# Patient Record
Sex: Male | Born: 1995 | Race: Black or African American | Hispanic: No | Marital: Single | Smoking: Never smoker
Health system: Southern US, Community
[De-identification: ages and names within clinical notes are randomized; demographics above are authoritative.]

## PROBLEM LIST (undated history)

## (undated) HISTORY — PX: KNEE SURGERY: SHX244

---

## 2018-09-18 ENCOUNTER — Other Ambulatory Visit: Payer: Self-pay | Admitting: Family Medicine

## 2018-09-18 ENCOUNTER — Ambulatory Visit
Admission: RE | Admit: 2018-09-18 | Discharge: 2018-09-18 | Disposition: A | Discharge status: Home / Self Care | Source: Ambulatory Visit | Attending: Family Medicine | Admitting: Family Medicine

## 2018-09-18 DIAGNOSIS — T1490XA Injury, unspecified, initial encounter: Secondary | ICD-10-CM

## 2018-09-18 DIAGNOSIS — S6992XA Unspecified injury of left wrist, hand and finger(s), initial encounter: Secondary | ICD-10-CM | POA: Insufficient documentation

## 2018-09-18 DIAGNOSIS — X58XXXA Exposure to other specified factors, initial encounter: Secondary | ICD-10-CM | POA: Insufficient documentation

## 2019-03-08 ENCOUNTER — Emergency Department

## 2019-03-08 ENCOUNTER — Other Ambulatory Visit: Payer: Self-pay

## 2019-03-08 ENCOUNTER — Encounter: Payer: Self-pay | Admitting: Emergency Medicine

## 2019-03-08 ENCOUNTER — Emergency Department
Admission: EM | Admit: 2019-03-08 | Discharge: 2019-03-08 | Disposition: A | Discharge status: Home / Self Care | Attending: Emergency Medicine | Admitting: Emergency Medicine

## 2019-03-08 DIAGNOSIS — T84498A Other mechanical complication of other internal orthopedic devices, implants and grafts, initial encounter: Secondary | ICD-10-CM | POA: Diagnosis not present

## 2019-03-08 DIAGNOSIS — Y792 Prosthetic and other implants, materials and accessory orthopedic devices associated with adverse incidents: Secondary | ICD-10-CM | POA: Diagnosis not present

## 2019-03-08 MED ORDER — CEPHALEXIN 500 MG PO CAPS: 500.0000 mg | ORAL_CAPSULE | Freq: Three times a day (TID) | ORAL | 0 refills | Status: AC

## 2019-03-08 NOTE — Discharge Instructions (Addendum)
Follow-up with [facility].  Please call them in the morning for an appointment.  Dr. Martha Clan would like to see you in the office and he is aware you are coming to be evaluated.  Take the antibiotic as prescribed since the wound is open.  Return if worsening.

## 2019-03-08 NOTE — Consult Note (Signed)
Called by PA in ER, Greig Right regarding this patient.   He is reported to have exposed ACL hardware from the anterior tibia.  There is no active drainage reported.  I recommended the patient by put on antibiotics and to call our office for an appointment.  Patient will need hardware removed, which can likely be done later this week.

## 2019-03-08 NOTE — ED Provider Notes (Signed)
[facility] Emergency Department Provider Note  ____________________________________________   None    (approximate)  I have reviewed the triage vital signs and the nursing notes.   HISTORY  Chief Complaint Post-op Problem    HPI Justin Montgomery is a 23 y.o. male presents emergency department complaining of hardware from an ACL reconstruction poking through the skin of the left knee.  He states he had the reconstruction done in [place] in 2015.  States his body has been "trying to recheck the hardware since it was done".  He states that within the last week the hardware actually poked through the skin.  He denies any fever chills.  Denies any drainage from the area.    History reviewed. No pertinent past medical history.  There are no active problems to display for this patient.   Past Surgical History:  Procedure Laterality Date  . KNEE SURGERY      Prior to Admission medications   Medication Sig Start Date End Date Taking? Authorizing Provider  cephALEXin (KEFLEX) 500 MG capsule Take 1 capsule (500 mg total) by mouth 3 (three) times daily. 03/08/19   Faythe Ghee, PA-C    Allergies Patient has no known allergies.  No family history on file.  Social History Social History   Tobacco Use  . Smoking status: Not on file  Substance Use Topics  . Alcohol use: Not on file  . Drug use: Not on file    Review of Systems  Constitutional: No fever/chills Eyes: No visual changes. ENT: No sore throat. Respiratory: Denies cough Genitourinary: Negative for dysuria. Musculoskeletal: Negative for back pain.  Hardware from surgery showing through the left knee Skin: Negative for rash.    ____________________________________________   PHYSICAL EXAM:  VITAL SIGNS: ED Triage Vitals [03/08/19 1508]  Enc Vitals Group     BP (!) 164/92     Pulse Rate (!) 51     Resp 20     Temp 98.3 F (36.8 C)     Temp Source Oral     SpO2 100 %      Weight 170 lb (77.1 kg)     Height 5\' 11"  (1.803 m)     Head Circumference      Peak Flow      Pain Score 3     Pain Loc      Pain Edu?      Excl. in GC?     Constitutional: Alert and oriented. Well appearing and in no acute distress. Eyes: Conjunctivae are normal.  Head: Atraumatic. Nose: No congestion/rhinnorhea. Mouth/Throat: Mucous membranes are moist.   Neck:  supple no lymphadenopathy noted Cardiovascular: Normal rate, regular rhythm. H Respiratory: Normal respiratory effort.  No retractions GU: deferred Musculoskeletal: FROM all extremities, warm and well perfused, the anterior aspect of the left knee has open skin where he can see the plastic screw from his ACL repair.  No drainage is noted. Neurologic:  Normal speech and language.  Skin:  Skin is warm, dry and intact. No rash noted. Psychiatric: Mood and affect are normal. Speech and behavior are normal.  ____________________________________________   LABS (all labs ordered are listed, but only abnormal results are displayed)  Labs Reviewed - No data to display ____________________________________________   ____________________________________________  RADIOLOGY  X-ray of the left knee shows partial tunneling of the ACL repair  ____________________________________________   PROCEDURES  Procedure(s) performed: No  Procedures    ____________________________________________   INITIAL IMPRESSION / ASSESSMENT AND  PLAN / ED COURSE  Pertinent labs & imaging results that were available during my care of the patient were reviewed by me and considered in my medical decision making (see chart for details).   Patient is a 23 year old male presents emergency department complaining of his hardware showing through the skin of his left knee from [place]  Physical exam shows the hardware to be showing 3 (skin.  No drainage or redness is noted.  X-ray of the left  knee shows partial total knee  Page Dr. Martha ClanKrasinski     As part of my medical decision making, I reviewed the following data within the electronic MEDICAL RECORD NUMBER Nursing notes reviewed and incorporated, Old chart reviewed, Radiograph reviewed x-ray of the left knee shows tunneling of the ACL with exposed hardware, A consult was requested and obtained from this/these consultant(s) Orthopedics, Evaluated by EM attending Dr. Scotty CourtStafford, Notes from prior ED visits and [place] Controlled Substance Database  ____________________________________________   FINAL CLINICAL IMPRESSION(S) / ED DIAGNOSES  Final diagnoses:  Exposed orthopaedic hardware (HCC)      NEW MEDICATIONS STARTED DURING THIS VISIT:  New Prescriptions   CEPHALEXIN (KEFLEX) 500 MG CAPSULE    Take 1 capsule (500 mg total) by mouth 3 (three) times daily.     Note:  This document was prepared using Dragon voice recognition software and may include unintentional dictation errors.    Faythe GheeFisher, Valery Amedee W, PA-C 03/08/19 1628    Sharman CheekStafford, Phillip, MD 03/08/19 [phone]

## 2019-03-08 NOTE — ED Triage Notes (Signed)
States had a L knee surgery 5 years ago. States 5 days ago began to be able to see hardware coming through skin.

## 2019-03-08 NOTE — ED Notes (Signed)

## 2019-03-11 ENCOUNTER — Ambulatory Visit: Admitting: Anesthesiology

## 2019-03-11 ENCOUNTER — Ambulatory Visit
Admission: RE | Admit: 2019-03-11 | Discharge: 2019-03-11 | Disposition: A | Discharge status: Home / Self Care | Source: Ambulatory Visit | Attending: Orthopedic Surgery | Admitting: Orthopedic Surgery

## 2019-03-11 ENCOUNTER — Other Ambulatory Visit: Payer: Self-pay | Admitting: Orthopedic Surgery

## 2019-03-11 ENCOUNTER — Encounter
Admission: RE | Disposition: A | Discharge status: Home / Self Care | Payer: Self-pay | Source: Ambulatory Visit | Attending: Orthopedic Surgery

## 2019-03-11 ENCOUNTER — Encounter: Payer: Self-pay | Admitting: Anesthesiology

## 2019-03-11 ENCOUNTER — Other Ambulatory Visit: Payer: Self-pay

## 2019-03-11 DIAGNOSIS — Z419 Encounter for procedure for purposes other than remedying health state, unspecified: Secondary | ICD-10-CM

## 2019-03-11 DIAGNOSIS — Y831 Surgical operation with implant of artificial internal device as the cause of abnormal reaction of the patient, or of later complication, without mention of misadventure at the time of the procedure: Secondary | ICD-10-CM | POA: Diagnosis not present

## 2019-03-11 DIAGNOSIS — T84197A Other mechanical complication of internal fixation device of bone of left lower leg, initial encounter: Secondary | ICD-10-CM | POA: Insufficient documentation

## 2019-03-11 HISTORY — PX: INCISION AND DRAINAGE OF WOUND: SHX1803

## 2019-03-11 SURGERY — IRRIGATION AND DEBRIDEMENT WOUND
Anesthesia: General | Site: Knee | Laterality: Left

## 2019-03-11 MED ORDER — ONDANSETRON HCL 4 MG/2ML IJ SOLN
INTRAMUSCULAR | Status: AC
  Filled 2019-03-11: qty 2

## 2019-03-11 MED ORDER — ACETAMINOPHEN 10 MG/ML IV SOLN
INTRAVENOUS | Status: AC
  Filled 2019-03-11: qty 100

## 2019-03-11 MED ORDER — OXYCODONE HCL 5 MG PO TABS: 5.0000 mg | ORAL_TABLET | Freq: Once | ORAL | Status: DC | PRN

## 2019-03-11 MED ORDER — PROPOFOL 10 MG/ML IV BOLUS
INTRAVENOUS | Status: DC | PRN
  Administered 2019-03-11: 150 mg via INTRAVENOUS

## 2019-03-11 MED ORDER — CEFAZOLIN SODIUM-DEXTROSE 2-4 GM/100ML-% IV SOLN
INTRAVENOUS | Status: AC
  Filled 2019-03-11: qty 100

## 2019-03-11 MED ORDER — FENTANYL CITRATE (PF) 100 MCG/2ML IJ SOLN
INTRAMUSCULAR | Status: AC
  Filled 2019-03-11: qty 2

## 2019-03-11 MED ORDER — MIDAZOLAM HCL 2 MG/2ML IJ SOLN
INTRAMUSCULAR | Status: AC
  Filled 2019-03-11: qty 2

## 2019-03-11 MED ORDER — ONDANSETRON HCL 4 MG PO TABS: 4.0000 mg | ORAL_TABLET | Freq: Three times a day (TID) | ORAL | 0 refills | Status: AC | PRN

## 2019-03-11 MED ORDER — ACETAMINOPHEN 10 MG/ML IV SOLN
INTRAVENOUS | Status: DC | PRN
  Administered 2019-03-11: 1000 mg via INTRAVENOUS

## 2019-03-11 MED ORDER — FENTANYL CITRATE (PF) 100 MCG/2ML IJ SOLN
INTRAMUSCULAR | Status: DC | PRN
  Administered 2019-03-11 (×3): 25 ug via INTRAVENOUS

## 2019-03-11 MED ORDER — PROPOFOL 10 MG/ML IV BOLUS
INTRAVENOUS | Status: AC
  Filled 2019-03-11: qty 20

## 2019-03-11 MED ORDER — GENTAMICIN SULFATE 40 MG/ML IJ SOLN
INTRAMUSCULAR | Status: AC
  Filled 2019-03-11: qty 2

## 2019-03-11 MED ORDER — BUPIVACAINE HCL (PF) 0.5 % IJ SOLN
INTRAMUSCULAR | Status: DC | PRN
  Administered 2019-03-11: 8 mL

## 2019-03-11 MED ORDER — HYDROCODONE-ACETAMINOPHEN 5-325 MG PO TABS: 1.0000 | ORAL_TABLET | ORAL | 0 refills | Status: AC | PRN

## 2019-03-11 MED ORDER — ONDANSETRON HCL 4 MG/2ML IJ SOLN
INTRAMUSCULAR | Status: DC | PRN
  Administered 2019-03-11: 4 mg via INTRAVENOUS

## 2019-03-11 MED ORDER — DEXAMETHASONE SODIUM PHOSPHATE 10 MG/ML IJ SOLN
INTRAMUSCULAR | Status: AC
  Filled 2019-03-11: qty 1

## 2019-03-11 MED ORDER — MIDAZOLAM HCL 2 MG/2ML IJ SOLN
INTRAMUSCULAR | Status: DC | PRN
  Administered 2019-03-11: 2 mg via INTRAVENOUS

## 2019-03-11 MED ORDER — GLYCOPYRROLATE 0.2 MG/ML IJ SOLN
INTRAMUSCULAR | Status: AC
  Filled 2019-03-11: qty 1

## 2019-03-11 MED ORDER — DEXAMETHASONE SODIUM PHOSPHATE 10 MG/ML IJ SOLN
INTRAMUSCULAR | Status: DC | PRN
  Administered 2019-03-11: 10 mg via INTRAVENOUS

## 2019-03-11 MED ORDER — OXYCODONE HCL 5 MG/5ML PO SOLN: 5.0000 mg | Freq: Once | ORAL | Status: DC | PRN

## 2019-03-11 MED ORDER — LIDOCAINE HCL (CARDIAC) PF 100 MG/5ML IV SOSY
PREFILLED_SYRINGE | INTRAVENOUS | Status: DC | PRN
  Administered 2019-03-11: 100 mg via INTRAVENOUS

## 2019-03-11 MED ORDER — CEFAZOLIN SODIUM-DEXTROSE 2-4 GM/100ML-% IV SOLN
2.0000 g | Freq: Once | INTRAVENOUS | Status: AC
  Administered 2019-03-11: 2 g via INTRAVENOUS

## 2019-03-11 MED ORDER — GLYCOPYRROLATE 0.2 MG/ML IJ SOLN
INTRAMUSCULAR | Status: DC | PRN
  Administered 2019-03-11: 0.2 mg via INTRAVENOUS

## 2019-03-11 MED ORDER — LACTATED RINGERS IV SOLN
INTRAVENOUS | Status: DC
  Administered 2019-03-11: 11:00:00 via INTRAVENOUS

## 2019-03-11 MED ORDER — FENTANYL CITRATE (PF) 100 MCG/2ML IJ SOLN: 25.0000 ug | INTRAMUSCULAR | Status: DC | PRN

## 2019-03-11 MED ORDER — SODIUM CHLORIDE 0.9 % IV SOLN
INTRAVENOUS | Status: DC | PRN
  Administered 2019-03-11: 11:00:00 1000 mL

## 2019-03-11 SURGICAL SUPPLY — 40 items
BANDAGE ACE 4X5 VEL STRL LF (GAUZE/BANDAGES/DRESSINGS) ×3 IMPLANT
BANDAGE ACE 6X5 VEL STRL LF (GAUZE/BANDAGES/DRESSINGS) ×3 IMPLANT
BLADE SURG 15 STRL LF DISP TIS (BLADE) ×1 IMPLANT
BLADE SURG 15 STRL SS (BLADE) ×2
BNDG ESMARK 4X12 TAN STRL LF (GAUZE/BANDAGES/DRESSINGS) ×3 IMPLANT
CANISTER SUCT 1200ML W/VALVE (MISCELLANEOUS) ×3 IMPLANT
CANISTER SUCT 3000ML PPV (MISCELLANEOUS) ×3 IMPLANT
CAST PADDING 3X4FT ST 30246 (SOFTGOODS) ×2
COVER WAND RF STERILE (DRAPES) ×3 IMPLANT
CUFF TOURN SGL QUICK 24 (TOURNIQUET CUFF)
CUFF TOURN SGL QUICK 30 (TOURNIQUET CUFF)
CUFF TRNQT CYL 24X4X16.5-23 (TOURNIQUET CUFF) IMPLANT
CUFF TRNQT CYL 30X4X21-28X (TOURNIQUET CUFF) IMPLANT
DRAPE U-SHAPE 47X51 STRL (DRAPES) ×3 IMPLANT
DURAPREP 26ML APPLICATOR (WOUND CARE) ×6 IMPLANT
ELECT REM PT RETURN 9FT ADLT (ELECTROSURGICAL) ×3
ELECTRODE REM PT RTRN 9FT ADLT (ELECTROSURGICAL) ×1 IMPLANT
GAUZE SPONGE 4X4 12PLY STRL (GAUZE/BANDAGES/DRESSINGS) ×3 IMPLANT
GAUZE XEROFORM 1X8 LF (GAUZE/BANDAGES/DRESSINGS) ×3 IMPLANT
GLOVE BIOGEL PI IND STRL 9 (GLOVE) ×1 IMPLANT
GLOVE BIOGEL PI INDICATOR 9 (GLOVE) ×2
GLOVE SURG 9.0 ORTHO LTXF (GLOVE) ×6 IMPLANT
GOWN STRL REUS TWL 2XL XL LVL4 (GOWN DISPOSABLE) ×3 IMPLANT
GOWN STRL REUS W/ TWL LRG LVL3 (GOWN DISPOSABLE) ×1 IMPLANT
GOWN STRL REUS W/TWL LRG LVL3 (GOWN DISPOSABLE) ×2
MAT ABSORB  FLUID 56X50 GRAY (MISCELLANEOUS) ×2
MAT ABSORB FLUID 56X50 GRAY (MISCELLANEOUS) ×1 IMPLANT
NS IRRIG 1000ML POUR BTL (IV SOLUTION) ×3 IMPLANT
PACK EXTREMITY ARMC (MISCELLANEOUS) ×3 IMPLANT
PAD ABD DERMACEA PRESS 5X9 (GAUZE/BANDAGES/DRESSINGS) ×3 IMPLANT
PAD CAST CTTN 3X4 STRL (SOFTGOODS) ×1 IMPLANT
PULSAVAC PLUS IRRIG FAN TIP (DISPOSABLE)
SOL .9 NS 3000ML IRR  AL (IV SOLUTION)
SOL .9 NS 3000ML IRR UROMATIC (IV SOLUTION) IMPLANT
STOCKINETTE STRL 4IN 9604848 (GAUZE/BANDAGES/DRESSINGS) ×3 IMPLANT
SUT VIC AB 2-0 CT1 27 (SUTURE) ×2
SUT VIC AB 2-0 CT1 TAPERPNT 27 (SUTURE) ×1 IMPLANT
SUT VIC AB 3-0 SH 27 (SUTURE) ×2
SUT VIC AB 3-0 SH 27X BRD (SUTURE) ×1 IMPLANT
TIP FAN IRRIG PULSAVAC PLUS (DISPOSABLE) IMPLANT

## 2019-03-11 NOTE — Transfer of Care (Signed)
Immediate Anesthesia Transfer of Care Note  Patient: Justin Montgomery  Procedure(s) Performed: IRRIGATION AND DEBRIDEMENT WOUND ACL SCREW REMOVAL (Left Knee)  Patient Location: PACU  Anesthesia Type:General  Level of Consciousness: sedated  Airway & Oxygen Therapy: Patient Spontanous Breathing and Patient connected to face mask oxygen  Post-op Assessment: Report given to RN and Post -op Vital signs reviewed and stable  Post vital signs: Reviewed and stable  Last Vitals:  Vitals Value Taken Time  BP 125/77 03/11/2019 12:05 PM  Temp 36.7 C 03/11/2019 12:05 PM  Pulse 84 03/11/2019 12:07 PM  Resp 20 03/11/2019 12:07 PM  SpO2 100 % 03/11/2019 12:07 PM  Vitals shown include unvalidated device data.  Last Pain:  Vitals:   03/11/19 1031  TempSrc: Temporal  PainSc: 0-No pain         Complications: No apparent anesthesia complications

## 2019-03-11 NOTE — Anesthesia Post-op Follow-up Note (Signed)
Anesthesia QCDR form completed.        

## 2019-03-11 NOTE — Op Note (Addendum)
03/11/2019  12:23 PM  PATIENT:  Justin Montgomery    PRE-OPERATIVE DIAGNOSIS:  EXPOSED ACL HARDWARE  POST-OPERATIVE DIAGNOSIS:  Same  PROCEDURE:  IRRIGATION AND DEBRIDEMENT OF LEFT KNEE WOUND AND  ACL TIBIAL HARDWARE REMOVAL  SURGEON:  Thornton Park, MD  ANESTHESIA:   General  Tourniquet time:  26 minutes  PREOPERATIVE INDICATIONS:  Justin Montgomery is a  23 y.o. male with a diagnosis of EXPOSED ACL HARDWARE of the left knee for a few days.  Patient states he has had interim swelling over the tibial surgical site.  Patient was seen in the emergency department on 03/09/2019 and placed on antibiotics.  Denies fevers chills or other signs of infection.  I discussed the risks and benefits of surgery. The risks include but are not limited to infection, nerve or blood vessel injury, joint stiffness or loss of motion, persistent pain, weakness or instability, hardware failure and the need for further surgery. Medical risks include but are not limited to DVT and pulmonary embolism, myocardial infarction, stroke, pneumonia, respiratory failure and death. Patient understood these risks and wished to proceed.   OPERATIVE IMPLANTS: None  OPERATIVE FINDINGS: Closed left tibial ACL fixation device  OPERATIVE PROCEDURE: Patient was met in the preoperative area.  A preop H&P was performed.  The left knee was signed with the word yes and my initials according the hospital's correct site of surgery protocol.  The patient's request I spoke with his mother by phone prior to surgery.  I explained to both the patient and his mother the details of the operation as well as the postoperative course.  They were both in agreement with the surgical plan.  Patient was brought to the operating room where he was placed supine on the operative table.  He underwent general anesthesia.  A tourniquet was applied to the left thigh.  He was prepped and draped in a sterile fashion.  A timeout was performed to verify the patient  name, date of birth, medical record number, correct site of surgery and correct procedure to be performed.  The timeout was also used to confirm the patient received antibiotics.  He received 2 g of Kefzol prior to the onset of the case.  Once all in attendance were in agreement the case began.  Examination under anesthesia of the left knee demonstrated ROM 0-120 degrees.  Patient did not demonstrate increased laxity on Lachman's or anterior drawer testing and he had a negative pivot shift.  There was no laxity to varus or valgus stress testing.  Patient had approximately a 1 cm opening over the tibial hardware.  The hardware was visualized through this wound.  Patient had his left lower extremity Esmarch.  The tourniquet was inflated to 275 mmHg.  It was inflated for a total of 26 minutes.  The longitudinal opening was expanded to allow for full exposure of the tibial hardware.  The full incision was 2 cm.  Patient had a Biomet Tuneloc implant extending from the tibial tunnel.  This was easily removed with a Coker as it was very loose.  The wound was copiously irrigated.  The subcutaneous tissue was debrided and sent to the lab for infection analysis.  A culture swab of the tibial tunnel was also performed and sent to lab for Gram stain and culture.  There was no purulent drainage or other signs of infection at the time of surgery.  The tibial tunnel was gently curetted.  Again the wound including the tibial tunnel was  copiously irrigated with gentamicin infused saline.  The skin edges of his initial wound were freshened with a #15 blade.    The wound was then closed with 3-0 Vicryl and 4-0 nylon.  Was injected with half percent Marcaine plain.  A dry sterile dressing was applied.  Tourniquet was deflated at 26 minutes.  The patient was awoken and brought to the PACU in stable condition.  I was scrubbed and present for the entire case.  I spoke with his mother by phone from the PACU to let her know her son  was stable in the recovery room the case had been performed without complication.  I will follow-up on the intraoperative cultures.  Patient will remain on his antibiotic until follow-up.

## 2019-03-11 NOTE — H&P (Signed)
PREOPERATIVE H&P  Chief Complaint: EXPOSED ACL HARDWARE, LEFT KNEE  HPI: Justin Montgomery is a 23 y.o. male who presents for preoperative history and physical with a diagnosis of EXPOSED TIBIAL ACL HARDWARE, LEFT KNEE x few days.  Patient had ACL reconstructed in 2015 in [place].  He states he has had issues with prominent hardware for a long time but it just came through the skin a few days ago.  He was seen in the [facility] emergency department.  A clean bandage was applied and he was placed on antibiotics.  He presented to my office this morning and the decision was made to proceed urgently with surgical removal of the implant to avoid infection.  History reviewed. No pertinent past medical history. Past Surgical History:  Procedure Laterality Date  . KNEE SURGERY     Social History   Socioeconomic History  . Marital status: Single    Spouse name: Not on file  . Number of children: Not on file  . Years of education: Not on file  . Highest education level: Not on file  Occupational History  . Not on file  Social Needs  . Financial resource strain: Not on file  . Food insecurity:    Worry: Not on file    Inability: Not on file  . Transportation needs:    Medical: Not on file    Non-medical: Not on file  Tobacco Use  . Smoking status: Not on file  Substance and Sexual Activity  . Alcohol use: Not on file  . Drug use: Not on file  . Sexual activity: Not on file  Lifestyle  . Physical activity:    Days per week: Not on file    Minutes per session: Not on file  . Stress: Not on file  Relationships  . Social connections:    Talks on phone: Not on file    Gets together: Not on file    Attends religious service: Not on file    Active member of club or organization: Not on file    Attends meetings of clubs or organizations: Not on file    Relationship status: Not on file  Other Topics Concern  . Not on file  Social History Narrative  . Not on file   History reviewed.  No pertinent family history. No Known Allergies Prior to Admission medications   Medication Sig Start Date End Date Taking? Authorizing Provider  cephALEXin (KEFLEX) 500 MG capsule Take 1 capsule (500 mg total) by mouth 3 (three) times daily. 03/08/19  Yes Fisher, Roselyn Bering, PA-C     Positive ROS: All other systems have been reviewed and were otherwise negative with the exception of those mentioned in the HPI and as above.  Physical Exam: General: Alert, no acute distress Cardiovascular: Regular rate and rhythm, no murmurs rubs or gallops.  No pedal edema Respiratory: Clear to auscultation bilaterally, no wheezes rales or rhonchi. No cyanosis, no use of accessory musculature GI: No organomegaly, abdomen is soft and non-tender nondistended with positive bowel sounds. Skin: Skin intact, no lesions within the operative field. Neurologic: Sensation intact distally Psychiatric: Patient is competent for consent with normal mood and affect Lymphatic: No cervical lymphadenopathy  MUSCULOSKELETAL: Left knee: Patient has a 1 cm opening of the skin overlying the tibial implant.  The tibial implant is visible through the open wound.  There is no drainage.  There is no significant swelling of the lower extremity.  There is no knee effusion.  Patient  has no significant tenderness.  He is distally neurovascular intact with intact motor function without weakness.  Assessment: EXPOSED ACL HARDWARE, LEFT KNEE  Plan: Plan for Procedure(s): IRRIGATION AND DEBRIDEMENT WOUND ACL SCREW REMOVAL, LEFT KNEE  I reviewed the details of the operation as well as the postoperative course the patient.  I have spoken with his mother by phone at his request.  She understands the plan for surgery and is in agreement with it.  I discussed the risks and benefits of surgery. The risks include but are not limited to infection, bleeding, nerve or blood vessel injury, joint stiffness or loss of motion, persistent pain, weakness  or instability,hardware failure and the need for further surgery. Medical risks include but are not limited to DVT and pulmonary embolism, myocardial infarction, stroke, pneumonia, respiratory failure and death. Patient understood these risks and wished to proceed.     Juanell FairlyKevin Ren Grasse, MD   03/11/2019 11:02 AM

## 2019-03-11 NOTE — Anesthesia Procedure Notes (Signed)
Procedure Name: LMA Insertion Date/Time: 03/11/2019 11:13 AM Performed by: Irving Burton, CRNA Pre-anesthesia Checklist: Patient identified, Emergency Drugs available, Suction available and Patient being monitored Patient Re-evaluated:Patient Re-evaluated prior to induction Oxygen Delivery Method: Circle system utilized Preoxygenation: Pre-oxygenation with 100% oxygen Induction Type: IV induction Ventilation: Mask ventilation without difficulty LMA: LMA inserted LMA Size: 5.0 Number of attempts: 1 Placement Confirmation: ETT inserted through vocal cords under direct vision,  positive ETCO2 and breath sounds checked- equal and bilateral Tube secured with: Tape Dental Injury: Teeth and Oropharynx as per pre-operative assessment

## 2019-03-11 NOTE — Anesthesia Preprocedure Evaluation (Signed)
Anesthesia Evaluation  Patient identified by MRN, date of birth, ID band Patient awake    Reviewed: Allergy & Precautions, H&P , NPO status , Patient's Chart, lab work & pertinent test results  History of Anesthesia Complications Negative for: history of anesthetic complications  Airway Mallampati: I  TM Distance: >3 FB Neck ROM: full    Dental  (+) Chipped   Pulmonary neg pulmonary ROS, neg shortness of breath,           Cardiovascular Exercise Tolerance: Good (-) angina(-) Past MI and (-) DOE negative cardio ROS       Neuro/Psych negative psych ROS   GI/Hepatic negative GI ROS, Neg liver ROS, neg GERD  ,  Endo/Other  negative endocrine ROS  Renal/GU      Musculoskeletal   Abdominal   Peds  Hematology negative hematology ROS (+)   Anesthesia Other Findings History reviewed. No pertinent past medical history.  Past Surgical History: No date: KNEE SURGERY  BMI    Body Mass Index:  23.71 kg/m      Reproductive/Obstetrics negative OB ROS                             Anesthesia Physical Anesthesia Plan  ASA: I  Anesthesia Plan: General LMA   Post-op Pain Management:    Induction: Intravenous  PONV Risk Score and Plan: Dexamethasone, Ondansetron, Midazolam and Treatment may vary due to age or medical condition  Airway Management Planned: LMA  Additional Equipment:   Intra-op Plan:   Post-operative Plan: Extubation in OR  Informed Consent: I have reviewed the patients History and Physical, chart, labs and discussed the procedure including the risks, benefits and alternatives for the proposed anesthesia with the patient or authorized representative who has indicated his/her understanding and acceptance.     Dental Advisory Given  Plan Discussed with: Anesthesiologist, CRNA and Surgeon  Anesthesia Plan Comments: (Patient consented for risks of anesthesia including but  not limited to:  - adverse reactions to medications - damage to teeth, lips or other oral mucosa - sore throat or hoarseness - Damage to heart, brain, lungs or loss of life  Patient voiced understanding.)        Anesthesia Quick Evaluation

## 2019-03-11 NOTE — Anesthesia Postprocedure Evaluation (Signed)
Anesthesia Post Note  Patient: Justin Montgomery  Procedure(s) Performed: IRRIGATION AND DEBRIDEMENT WOUND ACL SCREW REMOVAL (Left Knee)  Patient location during evaluation: PACU Anesthesia Type: General Level of consciousness: awake and alert Pain management: pain level controlled Vital Signs Assessment: post-procedure vital signs reviewed and stable Respiratory status: spontaneous breathing, nonlabored ventilation, respiratory function stable and patient connected to nasal cannula oxygen Cardiovascular status: blood pressure returned to baseline and stable Postop Assessment: no apparent nausea or vomiting Anesthetic complications: no     Last Vitals:  Vitals:   03/11/19 1250 03/11/19 1304  BP: 116/75 120/78  Pulse: (!) 57 (!) 56  Resp: 18 16  Temp: 36.6 C 36.8 C  SpO2: 100% 100%    Last Pain:  Vitals:   03/11/19 1304  TempSrc: Temporal  PainSc: 0-No pain                 Justin Montgomery

## 2019-03-11 NOTE — Discharge Instructions (Signed)
  AMBULATORY SURGERY  DISCHARGE INSTRUCTIONS   1) The drugs that you were given will stay in your system until tomorrow so for the next 24 hours you should not:  A) Drive an automobile B) Make any legal decisions C) Drink any alcoholic beverage   2) You may resume regular meals tomorrow.  Today it is better to start with liquids and gradually work up to solid foods.  You may eat anything you prefer, but it is better to start with liquids, then soup and crackers, and gradually work up to solid foods.   3) Please notify your doctor immediately if you have any unusual bleeding, trouble breathing, redness and pain at the surgery site, drainage, fever, or pain not relieved by medication.    4) Additional Instructions: TAKE A STOOL SOFTENER TWICE A DAY WHILE TAKING NARCOTIC PAIN MEDICINE TO PREVENT CONSTIPATION   Please contact your physician with any problems or Same Day Surgery at [phone], Monday through Friday 6 am to 4 pm, or [place] [facility] number at [phone].   

## 2019-03-16 LAB — AEROBIC/ANAEROBIC CULTURE W GRAM STAIN (SURGICAL/DEEP WOUND): Culture: NO GROWTH

## 2019-03-16 LAB — AEROBIC/ANAEROBIC CULTURE (SURGICAL/DEEP WOUND)

## 2020-07-18 IMAGING — DX LEFT KNEE - COMPLETE 4+ VIEW
4 series · 4 of 4 positions shown · non-contrast
Comparison: None.

CLINICAL DATA: Knee surgery 5 years ago. Surgical material visible
through the skin about 5 days ago.

EXAM:
LEFT KNEE - COMPLETE 4+ VIEW

[knee ap]
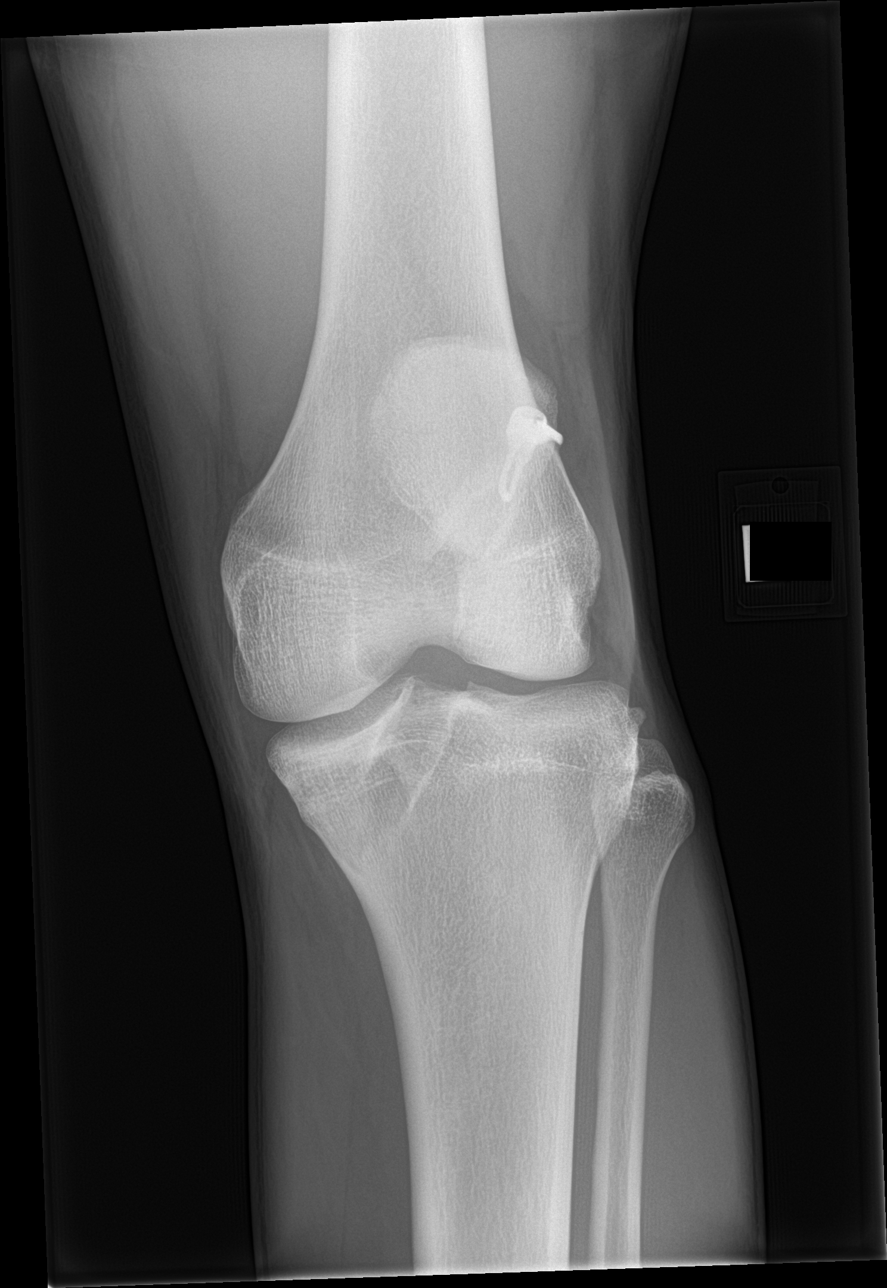

[knee lat]
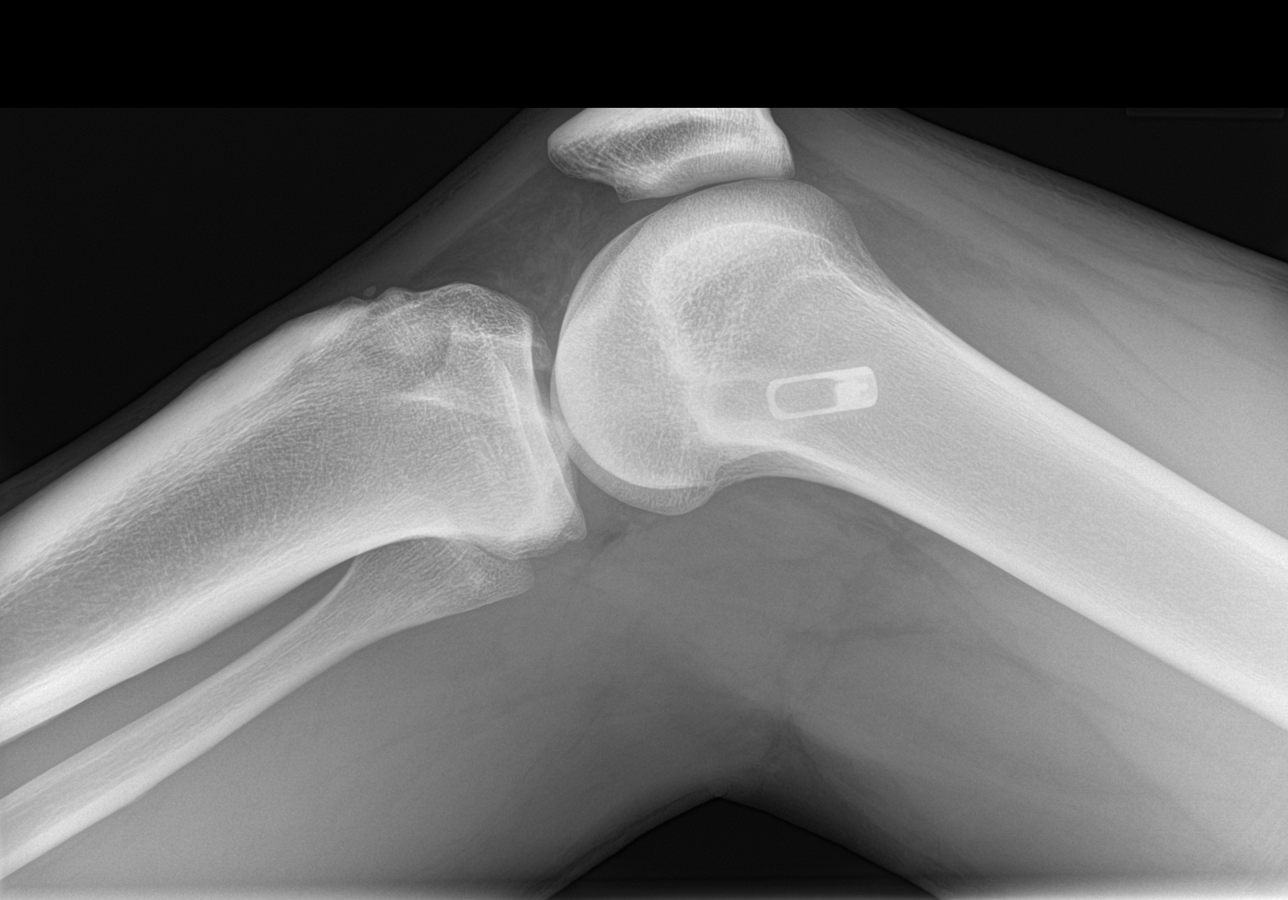

[knee obl (1 of 2)]
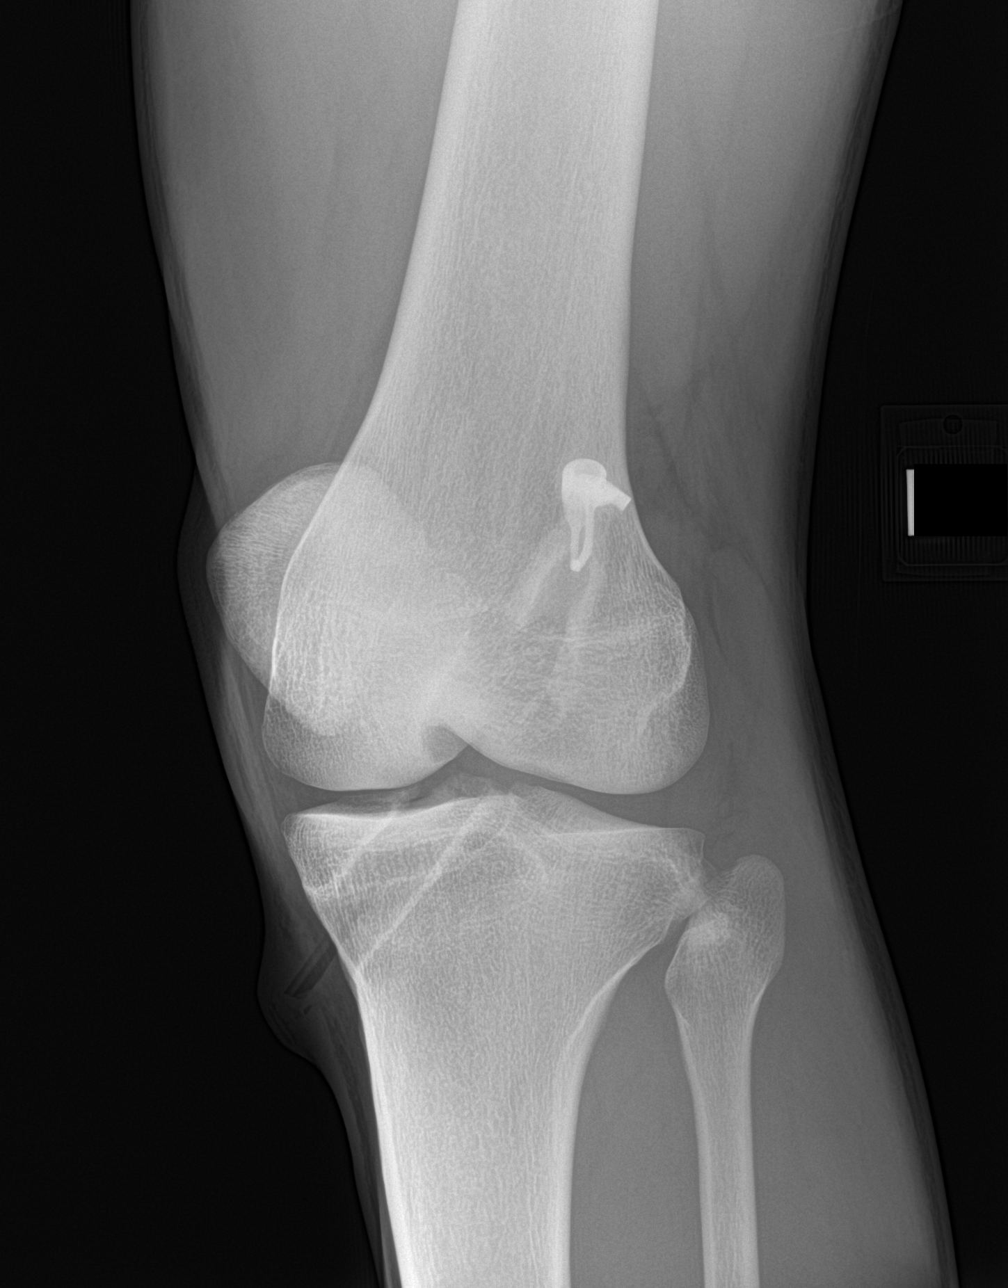

[knee obl (2 of 2)]
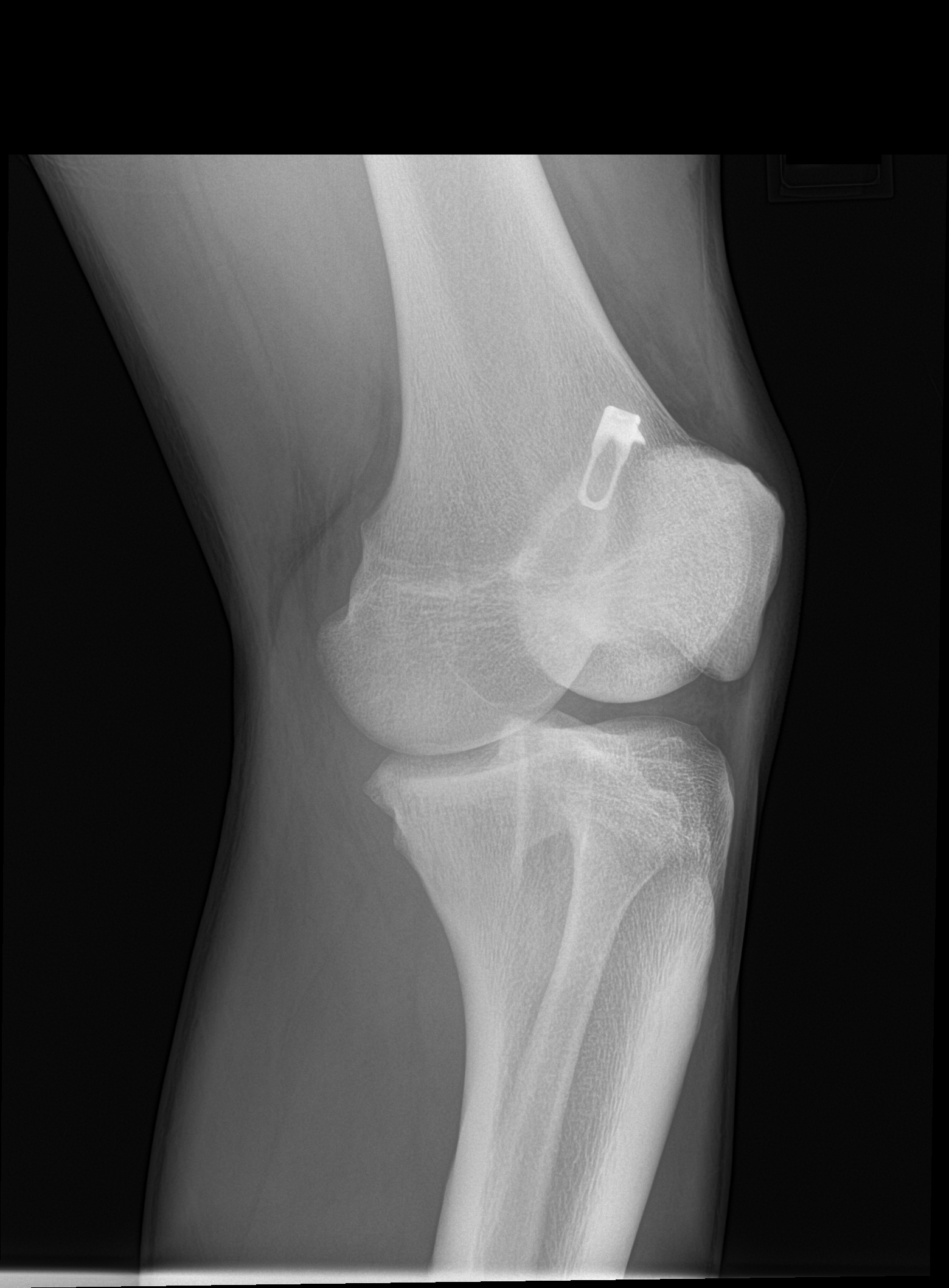

[4 of 4 positions shown; findings below may reference images not displayed]

FINDINGS: Patient has had a previous tunneled ACL repair. Femoral anchor
appears in proper place. Tunneled tibial component is coming out,
obviously tenting the skin and subcutaneous tissues. A lucent
portion of it is visible. No joint effusion. There are some mild
medial compartment degenerative changes.
IMPRESSION: Previous tunneled ACL repair. The tibial tunneled component is
partially extruded, tenting the overlying tissues.
